# Patient Record
Sex: Male | Born: 1995 | Race: White | Hispanic: Yes | Marital: Single | Smoking: Never smoker
Health system: Southern US, Community
[De-identification: ages and names within clinical notes are randomized; demographics above are authoritative.]

## PROBLEM LIST (undated history)

## (undated) DIAGNOSIS — S21339A Puncture wound without foreign body of unspecified front wall of thorax with penetration into thoracic cavity, initial encounter: Secondary | ICD-10-CM

---
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
## 2019-07-10 ENCOUNTER — Encounter (HOSPITAL_COMMUNITY): Payer: Self-pay

## 2019-07-10 ENCOUNTER — Emergency Department (HOSPITAL_COMMUNITY): Payer: Worker's Compensation

## 2019-07-10 ENCOUNTER — Other Ambulatory Visit: Payer: Self-pay

## 2019-07-10 ENCOUNTER — Emergency Department (HOSPITAL_COMMUNITY)
Admission: EM | Admit: 2019-07-10 | Discharge: 2019-07-11 | Disposition: A | Discharge status: Home / Self Care | Payer: Worker's Compensation | Attending: Emergency Medicine | Admitting: Emergency Medicine

## 2019-07-10 DIAGNOSIS — S32009A Unspecified fracture of unspecified lumbar vertebra, initial encounter for closed fracture: Secondary | ICD-10-CM | POA: Insufficient documentation

## 2019-07-10 DIAGNOSIS — Y999 Unspecified external cause status: Secondary | ICD-10-CM | POA: Diagnosis not present

## 2019-07-10 DIAGNOSIS — M549 Dorsalgia, unspecified: Secondary | ICD-10-CM | POA: Diagnosis present

## 2019-07-10 DIAGNOSIS — W11XXXA Fall on and from ladder, initial encounter: Secondary | ICD-10-CM | POA: Diagnosis not present

## 2019-07-10 DIAGNOSIS — S22080A Wedge compression fracture of T11-T12 vertebra, initial encounter for closed fracture: Secondary | ICD-10-CM | POA: Diagnosis not present

## 2019-07-10 DIAGNOSIS — N2889 Other specified disorders of kidney and ureter: Secondary | ICD-10-CM

## 2019-07-10 DIAGNOSIS — Y93H9 Activity, other involving exterior property and land maintenance, building and construction: Secondary | ICD-10-CM | POA: Insufficient documentation

## 2019-07-10 DIAGNOSIS — Y92017 Garden or yard in single-family (private) house as the place of occurrence of the external cause: Secondary | ICD-10-CM | POA: Insufficient documentation

## 2019-07-10 LAB — BASIC METABOLIC PANEL
Anion gap: 12 (ref 5–15)
BUN: 12 mg/dL (ref 6–20)
CO2: 21 mmol/L — ABNORMAL LOW (ref 22–32)
Calcium: 8.7 mg/dL — ABNORMAL LOW (ref 8.9–10.3)
Chloride: 104 mmol/L (ref 98–111)
Creatinine, Ser: 0.78 mg/dL (ref 0.61–1.24)
GFR calc Af Amer: >60 mL/min (ref >60)
GFR calc non Af Amer: >60 mL/min (ref >60)
Glucose, Bld: 114 mg/dL — ABNORMAL HIGH (ref 70–99)
Potassium: 3.8 mmol/L (ref 3.5–5.1)
Sodium: 137 mmol/L (ref 135–145)

## 2019-07-10 LAB — CBC WITH DIFFERENTIAL/PLATELET
Abs Immature Granulocytes: 0.09 10*3/uL — ABNORMAL HIGH (ref 0.00–0.07)
Basophils Absolute: 0.1 10*3/uL (ref 0.0–0.1)
Basophils Relative: 0 %
Eosinophils Absolute: 0 10*3/uL (ref 0.0–0.5)
Eosinophils Relative: 0 %
HCT: 41.8 % (ref 39.0–52.0)
Hemoglobin: 14.5 g/dL (ref 13.0–17.0)
Immature Granulocytes: 1 %
Lymphocytes Relative: 9 %
Lymphs Abs: 1.4 10*3/uL (ref 0.7–4.0)
MCH: 33 pg (ref 26.0–34.0)
MCHC: 34.7 g/dL (ref 30.0–36.0)
MCV: 95.2 fL (ref 80.0–100.0)
Monocytes Absolute: 0.9 10*3/uL (ref 0.1–1.0)
Monocytes Relative: 6 %
Neutro Abs: 12.4 10*3/uL — ABNORMAL HIGH (ref 1.7–7.7)
Neutrophils Relative %: 84 %
Platelets: 206 10*3/uL (ref 150–400)
RBC: 4.39 MIL/uL (ref 4.22–5.81)
RDW: 12.6 % (ref 11.5–15.5)
WBC: 14.8 10*3/uL — ABNORMAL HIGH (ref 4.0–10.5)
nRBC: 0 % (ref 0.0–0.2)

## 2019-07-10 MED ORDER — MORPHINE SULFATE (PF) 4 MG/ML IV SOLN
6.0000 mg | Freq: Once | INTRAVENOUS | Status: AC
  Administered 2019-07-10: 23:00:00 6 mg via INTRAVENOUS
  Filled 2019-07-10: qty 2

## 2019-07-10 MED ORDER — FENTANYL CITRATE (PF) 100 MCG/2ML IJ SOLN
100.0000 ug | Freq: Once | INTRAMUSCULAR | Status: AC
  Administered 2019-07-10: 20:00:00 100 ug via INTRAVENOUS
  Filled 2019-07-10: qty 2

## 2019-07-10 MED ORDER — IOHEXOL 300 MG/ML  SOLN
100.0000 mL | Freq: Once | INTRAMUSCULAR | Status: AC | PRN
  Administered 2019-07-10: 23:00:00 100 mL via INTRAVENOUS

## 2019-07-10 NOTE — ED Provider Notes (Signed)
[facility] EMERGENCY DEPARTMENT Provider Note   CSN: [id number] Arrival date & time: 07/10/19  1751     History Chief Complaint  Patient presents with  . Fall    Lawrence Chambers is a 23 y.o. male.  HPI Lawrence Chambers is a 23 y.o. male with no pertinent past medical history who presents to the ED for a fall.  He reports was 8 feet up on a ladder cleaning gutters on a back porch and accidentally fell backwards landing on his back.  He states somehow the ladder got between himself and the porch and hurt his lower back.  He denies any other pain or injuries.  Denies numbness, weakness, headache, syncope, vision change, chest pain, abdominal pain.  He denies any history of back problems.  No blood thinners.      History reviewed. No pertinent past medical history.  There are no problems to display for this patient.   History reviewed. No pertinent surgical history.     No family history on file.  Social History   Tobacco Use  . Smoking status: Not on file  Substance Use Topics  . Alcohol use: Not on file  . Drug use: Not on file    Home Medications Prior to Admission medications   Medication Sig Start Date End Date Taking? Authorizing Provider  oxyCODONE-acetaminophen (PERCOCET/ROXICET) 5-325 MG tablet Take 1 tablet by mouth every 8 (eight) hours as needed for severe pain. 07/11/19   McDonald, Mia A, PA-C    Allergies    Patient has no allergy information on record.  Review of Systems   Review of Systems  Constitutional: Negative for chills and fever.  HENT: Negative for congestion, ear pain and sore throat.   Eyes: Negative for pain, discharge, redness and visual disturbance.  Respiratory: Negative for cough and shortness of breath.   Cardiovascular: Negative for chest pain and palpitations.  Gastrointestinal: Negative for abdominal pain, diarrhea, nausea and vomiting.  Genitourinary: Negative for dysuria, frequency and hematuria.    Musculoskeletal: Positive for back pain. Negative for arthralgias and neck stiffness.  Skin: Negative for color change and rash.  Neurological: Negative for seizures, syncope and weakness.  Psychiatric/Behavioral: Negative for agitation.  All other systems reviewed and are negative.   Physical Exam Updated Vital Signs BP 129/78 (BP Location: Right Arm)   Pulse 70   Temp 98.8 F (37.1 C) (Oral)   Resp 18   SpO2 98%   Physical Exam Vitals and nursing note reviewed.  Constitutional:      General: He is not in acute distress.    Appearance: Normal appearance. He is well-developed. He is not ill-appearing.  HENT:     Head: Normocephalic and atraumatic.     Right Ear: External ear normal.     Left Ear: External ear normal.     Nose: Nose normal. No congestion.     Mouth/Throat:     Mouth: Mucous membranes are moist.  Eyes:     General:        Right eye: No discharge.        Left eye: No discharge.     Conjunctiva/sclera: Conjunctivae normal.  Cardiovascular:     Rate and Rhythm: Normal rate and regular rhythm.     Pulses: Normal pulses.     Heart sounds: Normal heart sounds. No murmur.  Pulmonary:     Effort: Pulmonary effort is normal. No respiratory distress.     Breath sounds: Normal breath sounds.  No wheezing or rales.  Abdominal:     General: Abdomen is flat. There is no distension.     Palpations: Abdomen is soft.     Tenderness: There is no abdominal tenderness.  Musculoskeletal:        General: Tenderness present. No signs of injury. Normal range of motion.     Cervical back: Normal range of motion and neck supple.     Comments: Midline lumbar tenderness to palpation, no step-off or deformities No thoracic or cervical midline tenderness  Skin:    General: Skin is warm and dry.     Capillary Refill: Capillary refill takes less than 2 seconds.  Neurological:     General: No focal deficit present.     Mental Status: He is alert and oriented to person, place, and  time. Mental status is at baseline.     Cranial Nerves: No cranial nerve deficit.     Sensory: No sensory deficit.     Motor: No weakness.     Coordination: Coordination normal.     Comments: Awake, alert, and oriented x4 Gait testing deferred Finger to nose intact Plantar response No pronator drift Sensation intact to LT Strength 5/5 in the BUE and BLE  CRANIAL NERVES: 2 (Optic)-PERRLA. VF intact to confrontation. 3/4/6 (Oculomotor, Trochlear, Abudcens)- EOMI 5 (Trigeminal)-Sensation intact topinprick 7 (Facial)-Symmetric facial expression 8 (Vestibulococchlear)-Responds to voice 9/10 (Glossopharyngeal)-Symmetric palate and uvula elevation 11 (Spinal accessory)-Head midline 12 (Hypoglossal)-Tongue Midline   Psychiatric:        Mood and Affect: Mood normal.        Behavior: Behavior normal.     ED Results / Procedures / Treatments   Labs (all labs ordered are listed, but only abnormal results are displayed) Labs Reviewed  BASIC METABOLIC PANEL - Abnormal; Notable for the following components:      Result Value   CO2 21 (*)    Glucose, Bld 114 (*)    Calcium 8.7 (*)    All other components within normal limits  CBC WITH DIFFERENTIAL/PLATELET - Abnormal; Notable for the following components:   WBC 14.8 (*)    Neutro Abs 12.4 (*)    Abs Immature Granulocytes 0.09 (*)    All other components within normal limits    EKG None  Radiology CT HEAD WO CONTRAST  Result Date: 07/10/2019 CLINICAL DATA:  Initial evaluation for acute trauma, fall. EXAM: CT HEAD WITHOUT CONTRAST TECHNIQUE: Contiguous axial images were obtained from the base of the skull through the vertex without intravenous contrast. COMPARISON:  None. FINDINGS: Brain: Cerebral volume within normal limits for patient age. No evidence for acute intracranial hemorrhage. No findings to suggest acute large vessel territory infarct. No mass lesion, midline shift, or mass effect. Ventricles are normal  in size without evidence for hydrocephalus. No extra-axial fluid collection identified. Vascular: No hyperdense vessel identified. Skull: Scalp soft tissues demonstrate no acute abnormality. Calvarium intact. Sinuses/Orbits: Globes and orbital soft tissues within normal limits. Visualized paranasal sinuses are clear. No mastoid effusion. IMPRESSION: Negative head CT.  No acute intracranial abnormality. Electronically Signed   By: Jeannine Boga M.D.   On: 07/10/2019 23:29   CT CHEST W CONTRAST  Result Date: 07/11/2019 CLINICAL DATA:  Initial evaluation for acute trauma, fall from ladder. EXAM: CT CHEST, ABDOMEN, AND PELVIS WITH CONTRAST TECHNIQUE: Multidetector CT imaging of the chest, abdomen and pelvis was performed following the standard protocol during bolus administration of intravenous contrast. CONTRAST:  179mL OMNIPAQUE IOHEXOL 300 MG/ML  SOLN COMPARISON:  Comparison made with prior CTs of the thoracic and lumbar spine from earlier the same Lawrence Chambers. FINDINGS: CT CHEST FINDINGS Cardiovascular: Normal intravascular enhancement seen throughout the intrathoracic aorta without aneurysm or acute traumatic injury. Visualized great vessels intact and normal. Heart size within normal limits. No pericardial effusion. Limited assessment of the pulmonary arterial tree grossly unremarkable. Mediastinum/Nodes: Visualized thyroid within normal limits. No pathologically enlarged mediastinal, hilar, or axillary lymph nodes. No mediastinal hematoma or mass. Esophagus within normal limits. Lungs/Pleura: Tracheobronchial tree intact and patent. Lungs well inflated bilaterally. No focal infiltrates. No edema or effusion. No pneumothorax. No worrisome pulmonary nodule or mass. Musculoskeletal: External soft tissues demonstrate no acute finding. No acute fracture within the thorax. No discrete lytic or blastic osseous lesions. CT ABDOMEN PELVIS FINDINGS Hepatobiliary: Diffuse hypoattenuation liver consistent with steatosis.  Gallbladder within normal limits. No biliary dilatation. Pancreas: Pancreas within normal limits. Spleen: Spleen intact and normal. Adrenals/Urinary Tract: Adrenal glands within normal limits. Kidneys equal size with symmetric enhancement. There is a subtle 2.3 cm enhancing mass at the interpolar left kidney, best seen on delayed sequence (series 8, image 7). No other focal renal lesion. No nephrolithiasis or hydronephrosis. No hydroureter. Partially distended bladder within normal limits. Stomach/Bowel: Stomach within normal limits. No evidence for bowel obstruction or acute bowel injury. No acute inflammatory changes seen about the bowels. Vascular/Lymphatic: Normal intravascular enhancement seen throughout the intra-abdominal aorta and its branch vessels. No adenopathy. Reproductive: Prostate seminal vesicles within normal limits. Other: No free air or fluid. No mesenteric or retroperitoneal hematoma. Musculoskeletal: Small focus of soft tissue stranding seen involving the subcutaneous fat of the left gluteal region, which could reflect mild contusion (series 3, image 128). No acute fracture within the pelvis. Previously noted T12 and L1 vertebral body fractures again noted, better evaluated on prior CTs of the spine. No discrete or worrisome osseous lesions. IMPRESSION: 1. Acute T12 and L1 vertebral fractures, better evaluated on previous spinal CTs. 2. Small focus of soft tissue stranding involving the subcutaneous fat of the left gluteal region, which could reflect mild contusion. 3. No other CT evidence for acute traumatic injury within the chest, abdomen, and pelvis. 4. Subtle 2.3 cm mass at the interpolar left kidney, indeterminate, but could reflect an underlying renal neoplasm. Further evaluation with dedicated renal mass protocol MRI recommended. Additionally, urology consultation suggested as clinically warranted. 5. Hepatic steatosis. Electronically Signed   By: Rise Mu M.D.   On:  07/11/2019 00:48   CT Cervical Spine Wo Contrast  Result Date: 07/10/2019 CLINICAL DATA:  23 year old male with history of trauma after a fall from a ladder. EXAM: CT CERVICAL SPINE WITHOUT CONTRAST TECHNIQUE: Multidetector CT imaging of the cervical spine was performed without intravenous contrast. Multiplanar CT image reconstructions were also generated. COMPARISON:  No priors. FINDINGS: Alignment: Normal. Skull base and vertebrae: No acute fracture. No primary bone lesion or focal pathologic process. Soft tissues and spinal canal: No prevertebral fluid or swelling. No visible canal hematoma. Disc levels: No significant degenerative disc disease or facet arthropathy. Upper chest: Unremarkable. Other: There are no aggressive appearing lytic or blastic lesions noted in the visualized portions of the skeleton. IMPRESSION: 1. No evidence of significant acute traumatic injury to the cervical spine. Electronically Signed   By: Trudie Reed M.D.   On: 07/10/2019 20:41   CT Thoracic Spine Wo Contrast  Result Date: 07/10/2019 CLINICAL DATA:  Initial evaluation for acute trauma, fall. EXAM: CT THORACIC AND LUMBAR SPINE WITHOUT CONTRAST TECHNIQUE: Multidetector CT imaging  of the thoracic and lumbar spine was performed without contrast. Multiplanar CT image reconstructions were also generated. COMPARISON:  None. FINDINGS: CT THORACIC SPINE FINDINGS Alignment: Vertebral bodies normally aligned with preservation of the normal thoracic kyphosis. No listhesis. Vertebrae: Acute burst type compression fracture involving the L1 vertebral body noted, described on concomitant lumbar spine portion of this exam. Subtle height loss in conchae cavity present at the superior endplate of T12, suspicious for an acute compression fracture. Height loss measures no more than 20%. No bony retropulsion. Vertebral body height otherwise maintained. No other acute or chronic fracture. No discrete osseous lesions. Paraspinal and other  soft tissues: Paraspinous soft tissues demonstrate no acute finding. Partially visualized lungs are clear. Visualized visceral structures unremarkable. Disc levels: No significant disc pathology seen within the thoracic spine. No stenosis. CT LUMBAR SPINE FINDINGS Segmentation: Standard. Lowest well-formed disc space labeled the L5-S1 level. Alignment: Chronic bilateral pars defects at L5 with associated trace 3 mm spondylolisthesis. Alignment otherwise normal with preservation of the normal lumbar lordosis. Vertebrae: Acute burst type compression fracture involving the L1 vertebral body is seen. Associated height loss of up to 35% with trace 2 mm bony retropulsion. No significant spinal stenosis. There is an associated minimally displaced fracture of the superior aspect of the L1 spinous process (series 7, image 36). Remainder of the posterior elements appear intact. Vertebral body height otherwise maintained without acute or chronic fracture. Visualized sacrum and pelvis intact. SI joints approximated symmetric. No discrete osseous lesions. Paraspinal and other soft tissues: Paraspinous soft tissues demonstrate no acute finding. Visualized visceral structures and intra-abdominal contents within normal limits. Disc levels: L5-S1: Chronic bilateral pars defects with associated 3 mm spondylolisthesis. Associated broad base right subarticular disc protrusion with minimal endplate spurring. Protruding disc contacts and mildly displaces the descending right S1 nerve root as it courses through the right lateral recess (series 4, image 88). Central canal remains patent. Mild to moderate bilateral L5 foraminal stenosis. IMPRESSION: CT THORACIC SPINE IMPRESSION 1. Subtle concavity at the superior endplate of T12, suspicious for an acute compression fracture. Height loss measures no more than 20%. No bony retropulsion. 2. No other acute traumatic injury within the thoracic spine. CT LUMBAR SPINE IMPRESSION 1. Acute burst  type compression fracture involving the L1 vertebral body with up to 35% height loss and 2 mm bony retropulsion. No significant spinal stenosis. 2. Associated acute nondisplaced fracture of the L1 spinous process. 3. No other acute traumatic injury within the lumbar spine. 4. Chronic bilateral pars defects at L5-S1 with associated broad-based right subarticular disc protrusion, potentially affecting the descending right S1 nerve root. Electronically Signed   By: Rise MuBenjamin  McClintock M.D.   On: 07/10/2019 20:50   CT Lumbar Spine Wo Contrast  Result Date: 07/10/2019 CLINICAL DATA:  Initial evaluation for acute trauma, fall. EXAM: CT THORACIC AND LUMBAR SPINE WITHOUT CONTRAST TECHNIQUE: Multidetector CT imaging of the thoracic and lumbar spine was performed without contrast. Multiplanar CT image reconstructions were also generated. COMPARISON:  None. FINDINGS: CT THORACIC SPINE FINDINGS Alignment: Vertebral bodies normally aligned with preservation of the normal thoracic kyphosis. No listhesis. Vertebrae: Acute burst type compression fracture involving the L1 vertebral body noted, described on concomitant lumbar spine portion of this exam. Subtle height loss in conchae cavity present at the superior endplate of T12, suspicious for an acute compression fracture. Height loss measures no more than 20%. No bony retropulsion. Vertebral body height otherwise maintained. No other acute or chronic fracture. No discrete osseous lesions. Paraspinal and  other soft tissues: Paraspinous soft tissues demonstrate no acute finding. Partially visualized lungs are clear. Visualized visceral structures unremarkable. Disc levels: No significant disc pathology seen within the thoracic spine. No stenosis. CT LUMBAR SPINE FINDINGS Segmentation: Standard. Lowest well-formed disc space labeled the L5-S1 level. Alignment: Chronic bilateral pars defects at L5 with associated trace 3 mm spondylolisthesis. Alignment otherwise normal with  preservation of the normal lumbar lordosis. Vertebrae: Acute burst type compression fracture involving the L1 vertebral body is seen. Associated height loss of up to 35% with trace 2 mm bony retropulsion. No significant spinal stenosis. There is an associated minimally displaced fracture of the superior aspect of the L1 spinous process (series 7, image 36). Remainder of the posterior elements appear intact. Vertebral body height otherwise maintained without acute or chronic fracture. Visualized sacrum and pelvis intact. SI joints approximated symmetric. No discrete osseous lesions. Paraspinal and other soft tissues: Paraspinous soft tissues demonstrate no acute finding. Visualized visceral structures and intra-abdominal contents within normal limits. Disc levels: L5-S1: Chronic bilateral pars defects with associated 3 mm spondylolisthesis. Associated broad base right subarticular disc protrusion with minimal endplate spurring. Protruding disc contacts and mildly displaces the descending right S1 nerve root as it courses through the right lateral recess (series 4, image 88). Central canal remains patent. Mild to moderate bilateral L5 foraminal stenosis. IMPRESSION: CT THORACIC SPINE IMPRESSION 1. Subtle concavity at the superior endplate of T12, suspicious for an acute compression fracture. Height loss measures no more than 20%. No bony retropulsion. 2. No other acute traumatic injury within the thoracic spine. CT LUMBAR SPINE IMPRESSION 1. Acute burst type compression fracture involving the L1 vertebral body with up to 35% height loss and 2 mm bony retropulsion. No significant spinal stenosis. 2. Associated acute nondisplaced fracture of the L1 spinous process. 3. No other acute traumatic injury within the lumbar spine. 4. Chronic bilateral pars defects at L5-S1 with associated broad-based right subarticular disc protrusion, potentially affecting the descending right S1 nerve root. Electronically Signed   By:  Rise Mu M.D.   On: 07/10/2019 20:50   CT ABDOMEN PELVIS W CONTRAST  Result Date: 07/11/2019 CLINICAL DATA:  Initial evaluation for acute trauma, fall from ladder. EXAM: CT CHEST, ABDOMEN, AND PELVIS WITH CONTRAST TECHNIQUE: Multidetector CT imaging of the chest, abdomen and pelvis was performed following the standard protocol during bolus administration of intravenous contrast. CONTRAST:  OMNIPAQUE IOHEXOL 300 MG/ML  SOLN COMPARISON:  Comparison made with prior CTs of the thoracic and lumbar spine from earlier the same Lawrence Chambers. FINDINGS: CT CHEST FINDINGS Cardiovascular: Normal intravascular enhancement seen throughout the intrathoracic aorta without aneurysm or acute traumatic injury. Visualized great vessels intact and normal. Heart size within normal limits. No pericardial effusion. Limited assessment of the pulmonary arterial tree grossly unremarkable. Mediastinum/Nodes: Visualized thyroid within normal limits. No pathologically enlarged mediastinal, hilar, or axillary lymph nodes. No mediastinal hematoma or mass. Esophagus within normal limits. Lungs/Pleura: Tracheobronchial tree intact and patent. Lungs well inflated bilaterally. No focal infiltrates. No edema or effusion. No pneumothorax. No worrisome pulmonary nodule or mass. Musculoskeletal: External soft tissues demonstrate no acute finding. No acute fracture within the thorax. No discrete lytic or blastic osseous lesions. CT ABDOMEN PELVIS FINDINGS Hepatobiliary: Diffuse hypoattenuation liver consistent with steatosis. Gallbladder within normal limits. No biliary dilatation. Pancreas: Pancreas within normal limits. Spleen: Spleen intact and normal. Adrenals/Urinary Tract: Adrenal glands within normal limits. Kidneys equal size with symmetric enhancement. There is a subtle 2.3 cm enhancing mass at the interpolar left  kidney, best seen on delayed sequence (series 8, image 7). No other focal renal lesion. No nephrolithiasis or  hydronephrosis. No hydroureter. Partially distended bladder within normal limits. Stomach/Bowel: Stomach within normal limits. No evidence for bowel obstruction or acute bowel injury. No acute inflammatory changes seen about the bowels. Vascular/Lymphatic: Normal intravascular enhancement seen throughout the intra-abdominal aorta and its branch vessels. No adenopathy. Reproductive: Prostate seminal vesicles within normal limits. Other: No free air or fluid. No mesenteric or retroperitoneal hematoma. Musculoskeletal: Small focus of soft tissue stranding seen involving the subcutaneous fat of the left gluteal region, which could reflect mild contusion (series 3, image 128). No acute fracture within the pelvis. Previously noted T12 and L1 vertebral body fractures again noted, better evaluated on prior CTs of the spine. No discrete or worrisome osseous lesions. IMPRESSION: 1. Acute T12 and L1 vertebral fractures, better evaluated on previous spinal CTs. 2. Small focus of soft tissue stranding involving the subcutaneous fat of the left gluteal region, which could reflect mild contusion. 3. No other CT evidence for acute traumatic injury within the chest, abdomen, and pelvis. 4. Subtle 2.3 cm mass at the interpolar left kidney, indeterminate, but could reflect an underlying renal neoplasm. Further evaluation with dedicated renal mass protocol MRI recommended. Additionally, urology consultation suggested as clinically warranted. 5. Hepatic steatosis. Electronically Signed   By: Rise Mu M.D.   On: 07/11/2019 00:48    Procedures Procedures (including critical care time)  Medications Ordered in ED Medications  fentaNYL (SUBLIMAZE) injection 100 mcg (100 mcg Intravenous Given 07/10/19 1957)  iohexol (OMNIPAQUE) 300 MG/ML solution 100 mL (100 mLs Intravenous Contrast Given 07/10/19 2238)  morphine 4 MG/ML injection 6 mg (6 mg Intravenous Given 07/10/19 2254)  fentaNYL (SUBLIMAZE) injection 100 mcg (100  mcg Intravenous Given 07/11/19 0123)  oxyCODONE-acetaminophen (PERCOCET/ROXICET) 5-325 MG per tablet 1 tablet (1 tablet Oral Given 07/11/19 0222)  acetaminophen (TYLENOL) tablet 325 mg (325 mg Oral Given 07/11/19 0222)    ED Course  I have reviewed the triage vital signs and the nursing notes.  Pertinent labs & imaging results that were available during my care of the patient were reviewed by me and considered in my medical decision making (see chart for details).    MDM Rules/Calculators/A&P                      WERNER LABELLA is a 23 y.o. male with no pertinent past medical history who presents to the ED for a fall.  He reports was 8 feet up on a ladder cleaning gutters on a back porch and accidentally fell backwards landing on his back.  He states somehow the ladder got between himself and the porch and hurt his lower back.  He denies any other pain or injuries.  Denies numbness, weakness, headache, syncope, vision change, chest pain, abdominal pain.  He denies any history of back problems.  No blood thinners.  HPI and physical exam as above. He presents awake, alert, hemodynamically stable, afebrile, non toxic. He does have midline lumbar tenderness to palpation, no step-off or deformities. No thoracic or cervical midline tenderness. Nonfocal neurologic exam.   CT cervical thoracic and lumbar spine significant for: 1. Acute burst type compression fracture involving the L1 vertebral body with up to 35% height loss and 2 mm bony retropulsion. No significant spinal stenosis. 2. Associated acute nondisplaced fracture of the L1 spinous process. No acute injury to c spine.  For traumatic injury on spine imaging, obtained  further trauma workup and consulted neurosurgery who has recommended tlso brace when out of bed and follow up in office. At time of signout, pending results of his trauma imaging.  Pt care was handed off to Mia McDonald at [phone].  Complete history and physical and current plan have  been communicated.  Please refer to their note for the remainder of ED care and ultimate disposition. The above statements and care plan were discussed with my attending physician Dr Manus Gunning who voiced agreement with plan.     Final Clinical Impression(s) / ED Diagnoses Final diagnoses:  Compression fracture of T12 vertebra, initial encounter (HCC)  Closed fracture of spinous process of lumbar vertebra, initial encounter (HCC)  Left renal mass    Rx / DC Orders ED Discharge Orders         Ordered    oxyCODONE-acetaminophen (PERCOCET/ROXICET) 5-325 MG tablet  Every 8 hours PRN     07/11/19 0305           Niv Darley, Ladona Ridgel, MD 07/11/19 1456    Glynn Octave, MD 07/11/19 1607

## 2019-07-10 NOTE — Consult Note (Signed)
Chief Complaint   Chief Complaint  Patient presents with  . Fall    HPI   Consult requested by: Dr Day/Dr Manus Gunning EDP Reason for consult: T12 fx, L1 fracture  HPI: Lawrence Chambers is a 23 y.o. male with no past medical history who presented to ED after a fall. Was cleaning gutters and fell off ladder approx 9 foot. Landed on back. Developed severe lower back pain immediately. No head injury, no LOC. Underwent CT C/T/L spine which was significant for T12 and L1 fractures. NSY consultation requested. Complains of fairly moderate midline lower back pain, improved with meds given by EDP. Denies radicular symptoms. Denies bowel/bladder dysfunction.  There are no problems to display for this patient.   PMH: History reviewed. No pertinent past medical history.  PSH: History reviewed. No pertinent surgical history.  (Not in a hospital admission)   SH: Social History   Tobacco Use  . Smoking status: Not on file  Substance Use Topics  . Alcohol use: Not on file  . Drug use: Not on file    MEDS: Prior to Admission medications   Not on File    ALLERGY: Not on File  Social History   Tobacco Use  . Smoking status: Not on file  Substance Use Topics  . Alcohol use: Not on file     No family history on file.   ROS   Review of Systems  Constitutional: Negative.   HENT: Negative.   Eyes: Negative.   Respiratory: Negative.   Cardiovascular: Negative.   Gastrointestinal: Negative.   Genitourinary: Negative.   Musculoskeletal: Positive for back pain and falls. Negative for neck pain.  Skin: Negative.   Neurological: Negative for dizziness, tingling, tremors, sensory change, speech change, focal weakness, seizures, loss of consciousness, weakness and headaches.    Exam   Vitals:   07/10/19 2045 07/10/19 2046  BP:  120/63  Pulse: 97 (!) 104  Resp: (!) 22 (!) 22  Temp:    SpO2: 99% 98%   General appearance: WDWN, NAD Eyes: No scleral  injection Cardiovascular: Regular rate and rhythm without murmurs, rubs, gallops. No edema or variciosities. Distal pulses normal. Pulmonary: Effort normal, non-labored breathing Musculoskeletal:     Muscle tone upper extremities: Normal    Muscle tone lower extremities: Normal    Motor exam: Upper Extremities Deltoid Bicep Tricep Grip  Right 5/5 5/5 5/5 5/5  Left 5/5 5/5 5/5 5/5   Lower Extremity IP Quad PF DF EHL  Right 5/5 5/5 5/5 5/5 5/5  Left 5/5 5/5 5/5 5/5 5/5   Neurological Mental Status:    - Patient is awake, alert, oriented to person, place, month, year, and situation    - Patient is able to give a clear and coherent history.    - No signs of aphasia or neglect Cranial Nerves    - II: Visual Fields are full. PERRL    - III/IV/VI: EOMI without ptosis or diploplia.     - V: Facial sensation is grossly normal    - VII: Facial movement is symmetric.     - VIII: hearing is intact to voice    - X: Uvula elevates symmetrically    - XI: Shoulder shrug is symmetric.    - XII: tongue is midline without atrophy or fasciculations.  Sensory: Sensation grossly intact to LT  Results - Imaging/Labs   Results for orders placed or performed during the hospital encounter of 07/10/19 (from the past 48 hour(s))  CBC  with Differential     Status: Abnormal   Collection Time: 07/10/19  9:15 PM  Result Value Ref Range   WBC 14.8 (H) 4.0 - 10.5 K/uL   RBC 4.39 4.22 - 5.81 MIL/uL   Hemoglobin 14.5 13.0 - 17.0 g/dL   HCT 16.141.8 09.639.0 - 04.552.0 %   MCV 95.2 80.0 - 100.0 fL   MCH 33.0 26.0 - 34.0 pg   MCHC 34.7 30.0 - 36.0 g/dL   RDW 40.912.6 81.111.5 - 91.415.5 %   Platelets 206 150 - 400 K/uL   nRBC 0.0 0.0 - 0.2 %   Neutrophils Relative % 84 %   Neutro Abs 12.4 (H) 1.7 - 7.7 K/uL   Lymphocytes Relative 9 %   Lymphs Abs 1.4 0.7 - 4.0 K/uL   Monocytes Relative 6 %   Monocytes Absolute 0.9 0.1 - 1.0 K/uL   Eosinophils Relative 0 %   Eosinophils Absolute 0.0 0.0 - 0.5 K/uL   Basophils Relative 0 %    Basophils Absolute 0.1 0.0 - 0.1 K/uL   Immature Granulocytes 1 %   Abs Immature Granulocytes 0.09 (H) 0.00 - 0.07 K/uL    Comment: Performed at [facility], [address] [place], [place] [id number]    CT Cervical Spine Wo Contrast  Result Date: 07/10/2019 CLINICAL DATA:  23 year old male with history of trauma after a fall from a ladder. EXAM: CT CERVICAL SPINE WITHOUT CONTRAST TECHNIQUE: Multidetector CT imaging of the cervical spine was performed without intravenous contrast. Multiplanar CT image reconstructions were also generated. COMPARISON:  No priors. FINDINGS: Alignment: Normal. Skull base and vertebrae: No acute fracture. No primary bone lesion or focal pathologic process. Soft tissues and spinal canal: No prevertebral fluid or swelling. No visible canal hematoma. Disc levels: No significant degenerative disc disease or facet arthropathy. Upper chest: Unremarkable. Other: There are no aggressive appearing lytic or blastic lesions noted in the visualized portions of the skeleton. IMPRESSION: 1. No evidence of significant acute traumatic injury to the cervical spine. Electronically Signed   By: Trudie Reedaniel  Entrikin M.D.   On: 07/10/2019 20:41   CT Thoracic Spine Wo Contrast  Result Date: 07/10/2019 CLINICAL DATA:  Initial evaluation for acute trauma, fall. EXAM: CT THORACIC AND LUMBAR SPINE WITHOUT CONTRAST TECHNIQUE: Multidetector CT imaging of the thoracic and lumbar spine was performed without contrast. Multiplanar CT image reconstructions were also generated. COMPARISON:  None. FINDINGS: CT THORACIC SPINE FINDINGS Alignment: Vertebral bodies normally aligned with preservation of the normal thoracic kyphosis. No listhesis. Vertebrae: Acute burst type compression fracture involving the L1 vertebral body noted, described on concomitant lumbar spine portion of this exam. Subtle height loss in conchae cavity present at the superior endplate of T12, suspicious for an acute compression  fracture. Height loss measures no more than 20%. No bony retropulsion. Vertebral body height otherwise maintained. No other acute or chronic fracture. No discrete osseous lesions. Paraspinal and other soft tissues: Paraspinous soft tissues demonstrate no acute finding. Partially visualized lungs are clear. Visualized visceral structures unremarkable. Disc levels: No significant disc pathology seen within the thoracic spine. No stenosis. CT LUMBAR SPINE FINDINGS Segmentation: Standard. Lowest well-formed disc space labeled the L5-S1 level. Alignment: Chronic bilateral pars defects at L5 with associated trace 3 mm spondylolisthesis. Alignment otherwise normal with preservation of the normal lumbar lordosis. Vertebrae: Acute burst type compression fracture involving the L1 vertebral body is seen. Associated height loss of up to 35% with trace 2 mm bony retropulsion. No significant spinal stenosis. There  is an associated minimally displaced fracture of the superior aspect of the L1 spinous process (series 7, image 36). Remainder of the posterior elements appear intact. Vertebral body height otherwise maintained without acute or chronic fracture. Visualized sacrum and pelvis intact. SI joints approximated symmetric. No discrete osseous lesions. Paraspinal and other soft tissues: Paraspinous soft tissues demonstrate no acute finding. Visualized visceral structures and intra-abdominal contents within normal limits. Disc levels: L5-S1: Chronic bilateral pars defects with associated 3 mm spondylolisthesis. Associated broad base right subarticular disc protrusion with minimal endplate spurring. Protruding disc contacts and mildly displaces the descending right S1 nerve root as it courses through the right lateral recess (series 4, image 88). Central canal remains patent. Mild to moderate bilateral L5 foraminal stenosis. IMPRESSION: CT THORACIC SPINE IMPRESSION 1. Subtle concavity at the superior endplate of K09, suspicious  for an acute compression fracture. Height loss measures no more than 20%. No bony retropulsion. 2. No other acute traumatic injury within the thoracic spine. CT LUMBAR SPINE IMPRESSION 1. Acute burst type compression fracture involving the L1 vertebral body with up to 35% height loss and 2 mm bony retropulsion. No significant spinal stenosis. 2. Associated acute nondisplaced fracture of the L1 spinous process. 3. No other acute traumatic injury within the lumbar spine. 4. Chronic bilateral pars defects at L5-S1 with associated broad-based right subarticular disc protrusion, potentially affecting the descending right S1 nerve root. Electronically Signed   By: Jeannine Boga M.D.   On: 07/10/2019 20:50   CT Lumbar Spine Wo Contrast  Result Date: 07/10/2019 CLINICAL DATA:  Initial evaluation for acute trauma, fall. EXAM: CT THORACIC AND LUMBAR SPINE WITHOUT CONTRAST TECHNIQUE: Multidetector CT imaging of the thoracic and lumbar spine was performed without contrast. Multiplanar CT image reconstructions were also generated. COMPARISON:  None. FINDINGS: CT THORACIC SPINE FINDINGS Alignment: Vertebral bodies normally aligned with preservation of the normal thoracic kyphosis. No listhesis. Vertebrae: Acute burst type compression fracture involving the L1 vertebral body noted, described on concomitant lumbar spine portion of this exam. Subtle height loss in conchae cavity present at the superior endplate of F81, suspicious for an acute compression fracture. Height loss measures no more than 20%. No bony retropulsion. Vertebral body height otherwise maintained. No other acute or chronic fracture. No discrete osseous lesions. Paraspinal and other soft tissues: Paraspinous soft tissues demonstrate no acute finding. Partially visualized lungs are clear. Visualized visceral structures unremarkable. Disc levels: No significant disc pathology seen within the thoracic spine. No stenosis. CT LUMBAR SPINE FINDINGS  Segmentation: Standard. Lowest well-formed disc space labeled the L5-S1 level. Alignment: Chronic bilateral pars defects at L5 with associated trace 3 mm spondylolisthesis. Alignment otherwise normal with preservation of the normal lumbar lordosis. Vertebrae: Acute burst type compression fracture involving the L1 vertebral body is seen. Associated height loss of up to 35% with trace 2 mm bony retropulsion. No significant spinal stenosis. There is an associated minimally displaced fracture of the superior aspect of the L1 spinous process (series 7, image 36). Remainder of the posterior elements appear intact. Vertebral body height otherwise maintained without acute or chronic fracture. Visualized sacrum and pelvis intact. SI joints approximated symmetric. No discrete osseous lesions. Paraspinal and other soft tissues: Paraspinous soft tissues demonstrate no acute finding. Visualized visceral structures and intra-abdominal contents within normal limits. Disc levels: L5-S1: Chronic bilateral pars defects with associated 3 mm spondylolisthesis. Associated broad base right subarticular disc protrusion with minimal endplate spurring. Protruding disc contacts and mildly displaces the descending right S1 nerve root as  it courses through the right lateral recess (series 4, image 88). Central canal remains patent. Mild to moderate bilateral L5 foraminal stenosis. IMPRESSION: CT THORACIC SPINE IMPRESSION 1. Subtle concavity at the superior endplate of T12, suspicious for an acute compression fracture. Height loss measures no more than 20%. No bony retropulsion. 2. No other acute traumatic injury within the thoracic spine. CT LUMBAR SPINE IMPRESSION 1. Acute burst type compression fracture involving the L1 vertebral body with up to 35% height loss and 2 mm bony retropulsion. No significant spinal stenosis. 2. Associated acute nondisplaced fracture of the L1 spinous process. 3. No other acute traumatic injury within the lumbar  spine. 4. Chronic bilateral pars defects at L5-S1 with associated broad-based right subarticular disc protrusion, potentially affecting the descending right S1 nerve root. Electronically Signed   By: Rise Mu M.D.   On: 07/10/2019 20:50    Impression/Plan   23 y.o. male with T12 superior endplate compression fracture, L1 burst type fracture and L1 SP fracture after falling off a ladder. No significant retropulsion/spinal stenosis. He is neurologically intact.   No indication for acute NS intervention. Will treat conservatively with TLSO brace which is to be worn when upright and OOB. If patient is unable to ambulate without significant pain, would rec admission under [facility] for pain control.  Plan to f/u outpatient in several weeks for monitoring.   Of note, full trauma scans were ordered after CT spine findings and are still pending. Please call for any concerns should these be revealing.   Cindra Presume, PA-C [facility]

## 2019-07-10 NOTE — ED Triage Notes (Signed)
Pt bib ems from home after falling approx 9 feet off his ladder. Pt hit his back on the ladder on the fall down. No loc, no blood thinners. Cspine cleared on scene. VSS. 119mcg fentanyl given en route. No neuro deficits.  150/90 HR 80 RR 20 98% RA.

## 2019-07-10 NOTE — ED Triage Notes (Addendum)
PT fell approximately 9 feet off of a ladder after slipping. Pt stated that he landed on a wooden porch. Denies LOC. Stated that he landed on his back and c/o mid back pain. Denies blood thinners or incontinence.

## 2019-07-11 MED ORDER — FENTANYL CITRATE (PF) 100 MCG/2ML IJ SOLN
100.0000 ug | Freq: Once | INTRAMUSCULAR | Status: AC
  Administered 2019-07-11: 01:00:00 100 ug via INTRAVENOUS
  Filled 2019-07-11: qty 2

## 2019-07-11 MED ORDER — OXYCODONE-ACETAMINOPHEN 5-325 MG PO TABS
1.0000 | ORAL_TABLET | Freq: Once | ORAL | Status: AC
  Administered 2019-07-11: 02:00:00 1 via ORAL
  Filled 2019-07-11: qty 1

## 2019-07-11 MED ORDER — ACETAMINOPHEN 325 MG PO TABS
325.0000 mg | ORAL_TABLET | Freq: Once | ORAL | Status: AC
  Administered 2019-07-11: 02:00:00 325 mg via ORAL
  Filled 2019-07-11: qty 1

## 2019-07-11 MED ORDER — OXYCODONE-ACETAMINOPHEN 5-325 MG PO TABS: 1.0000 | ORAL_TABLET | Freq: Three times a day (TID) | ORAL | 0 refills | Status: AC | PRN

## 2019-07-11 NOTE — ED Notes (Signed)
Patient verbalizes understanding of discharge instructions. Opportunity for questioning and answers were provided. Armband removed by staff, pt discharged from ED w/ mother

## 2019-07-11 NOTE — ED Provider Notes (Signed)
23 year old male received at signout from Dr. Lovena Le Day, medical resident under the supervision of Dr. Wyvonnia Dusky. Per his HPI:   "Lawrence Chambers is a 23 y.o. male with no pertinent past medical history who presents to the ED for a fall.  He reports was 8 feet up on a ladder cleaning gutters on a back porch and accidentally fell backwards landing on his back.  He states somehow the ladder got between himself and the porch and hurt his lower back.  He denies any other pain or injuries.  Denies numbness, weakness, headache, syncope, vision change, chest pain, abdominal pain.  He denies any history of back problems.  No blood thinners."   Physical Exam  BP (!) 136/91 (BP Location: Right Arm)   Pulse 69   Temp 98.6 F (37 C) (Oral)   Resp 20   SpO2 97%   Physical Exam Vitals and nursing note reviewed.  Constitutional:      Appearance: He is well-developed. He is obese. He is not ill-appearing, toxic-appearing or diaphoretic.     Comments: Winces in pain with positional changes  HENT:     Head: Normocephalic.  Eyes:     Conjunctiva/sclera: Conjunctivae normal.  Cardiovascular:     Rate and Rhythm: Normal rate and regular rhythm.     Heart sounds: No murmur.  Pulmonary:     Effort: Pulmonary effort is normal.  Abdominal:     General: There is no distension.     Palpations: Abdomen is soft.  Musculoskeletal:     Cervical back: Neck supple.     Right lower leg: No edema.     Left lower leg: No edema.     Comments: TLSO brace is in place.   Skin:    General: Skin is warm and dry.  Neurological:     Mental Status: He is alert.  Psychiatric:        Behavior: Behavior normal.     ED Course/Procedures     Procedures  MDM  23 year old male who fell 8 feet from a ladder onto his back received at sign out from Dr. Lovena Le Day, medical resident under the supervision of Dr. Adaline Sill. He is pending CT imaging of his chest, abdomen, and pelvis.  Imaging of the head and spine have already  resulted and have been reviewed by the primary team.  Please see their notes for further work-up and medical decision making.  CT C/A/P again demonstrating T12 and L1 vertebral fractures.  There is a mild contusion noted in the left gluteal region.  There is also an incidental finding of a subtle 2.3 cm mass at the inner pole of the left kidney that is indeterminate.  01:10-patient evaluation.  He is sitting upright on the edge of the bed and wincing in pain.  Reports that his pain was well controlled after pain medication earlier, but it is now severe.  He is adamant that he would like to try to get his pain controlled and be discharged home and avoid admission.  Will reorder fentanyl and plan to ambulate the patient.  We discussed the findings of his CT abdomen pelvis, including concern for subtle 2.3 cm mass at the inner pole of the left kidney that may warrant renal MRI and outpatient urology follow-up.  Will reorder pain medication and reassess.  01:40-RN expresses concerns as she attempted to ambulate the patient almost immediately after giving a dose of fentanyl.  I evaluated the patient with her at bedside approximately 15  minutes after getting fentanyl and he is able to stand and ambulate with well-controlled pain.  He has had multiple doses of IV pain medication since he has been here.  Will order a dose of oral pain medication to transition the patient to ensure that his pain is well controlled and that he is able to ambulate prior to discharge.  03:00-patient reevaluation.  He received oral pain medication less than an hour ago.  He reports that his pain is still well controlled.  Is feeling much better and would like to go home.  He was ambulated by me in the room and continue to report pain was well controlled.  I need is reasonable for him to be discharged at this time.  Will discharge with pain medication and follow-up to neurosurgery.  He was given instructions on his TLSO brace.  He was  also given a referral to urology as he may need outpatient renal MRI.  All questions answered.  He is hemodynamically stable and in no acute distress.  ER return precautions given.  Safe for discharge home with outpatient follow-up as indicated above.    Barkley Boards, PA-C 07/11/19 [id number]    Shon Baton, MD 07/11/19 [phone]

## 2019-07-11 NOTE — ED Notes (Signed)
Ortho at bedside to place TLSO brace.

## 2019-07-11 NOTE — Progress Notes (Signed)
Orthopedic Tech Progress Note Patient Details:  Lawrence Chambers 1996/04/23 [id number]  Patient ID: Lawrence Chambers, male   DOB: 1996-02-12, 23 y.o.   MRN: [id number] Applied TLSO and taught pt how to apply brace.  Karolee Stamps 07/11/2019, 1:06 AM

## 2019-07-11 NOTE — Discharge Instructions (Addendum)
Gracias por permitirme atenderlo [employer] [facility].  Debe llamar a la oficina de neurociruga para programar una cita de seguimiento. Debe usar el cors TLSO en cualquier momento que est de pie y fuera de la cama.  Tome 650 mg de Tylenol o 600 mg de ibuprofeno con alimentos cada 6 horas para el dolor. Puede alternar entre estos 2 medicamentos cada 3 horas si el dolor regresa. Por ejemplo, puede tomar Tylenol al medioda, seguido de una dosis de ibuprofeno a las 3, seguida de Ardelia Mems segunda dosis de Tylenol y 6. Para el dolor intenso e incontrolado, puede tomar 1 tableta de Percocet cada 8 horas segn sea necesario. No trabaje ni conduzca mientras est tomando [employer]. No tome otras sustancias sedantes, como alcohol, mientras toma este medicamento. Puede causarle problemas. Tambin puede ser [employer].  Tambin debe llamar y programar una cita de seguimiento con urologa debido al rea que vieron en su rin izquierdo. Es posible que necesite una resonancia magntica para pacientes ambulatorios. La urologa puede ayudarlo con esto.  Regrese a la sala de emergencias si su dolor se vuelve incontrolable, si comienza a tener episodios que repiten su defecacin, si no puede caminar, presenta entumecimiento o debilidad nuevos, tiene una cada o lesin, o desarrolla otros sntomas nuevos y preocupantes.  Thank you for allowing me to care for you today in the Emergency Department.   You need to call the neurosurgery office to schedule a follow-up appointment.  You need to wear the TLSO brace anytime that you are upright and out of bed.   Take 650 mg of Tylenol or 600 mg of ibuprofen with food every 6 hours for pain.  You can alternate between these 2 medications every 3 hours if your pain returns.  For instance, you can take Tylenol at noon, followed by a dose of ibuprofen at 3, followed by second dose of Tylenol and 6.  For severe, uncontrolled pain, you can take 1 tablet of Percocet  every 8 hours as needed.  Do not work or drive while taking this medication.  Do not take other sedating substances, such as alcohol, while taking this medication.  It can cause you to be impaired.  It can also be addicting.  You also need to call and schedule a follow-up appointment with urology due to the area they saw on your left kidney.  You may need an outpatient MRI.  Urology can help you with this.  Return to the emergency department if your pain becomes uncontrolled, if you start having episodes repeat your poop on yourself, if you become unable to walk, develop new numbness or weakness, have any fall or injury, or develop other new, concerning symptoms.

## 2020-09-12 ENCOUNTER — Emergency Department
Admission: EM | Admit: 2020-09-12 | Discharge: 2020-09-13 | Disposition: A | Discharge status: Home / Self Care | Payer: Self-pay | Attending: Emergency Medicine | Admitting: Emergency Medicine

## 2020-09-12 ENCOUNTER — Other Ambulatory Visit: Payer: Self-pay

## 2020-09-12 ENCOUNTER — Encounter: Payer: Self-pay | Admitting: Emergency Medicine

## 2020-09-12 ENCOUNTER — Emergency Department: Payer: Self-pay

## 2020-09-12 DIAGNOSIS — S21201A Unspecified open wound of right back wall of thorax without penetration into thoracic cavity, initial encounter: Secondary | ICD-10-CM | POA: Insufficient documentation

## 2020-09-12 DIAGNOSIS — W3400XA Accidental discharge from unspecified firearms or gun, initial encounter: Secondary | ICD-10-CM | POA: Insufficient documentation

## 2020-09-12 DIAGNOSIS — S21231A Puncture wound without foreign body of right back wall of thorax without penetration into thoracic cavity, initial encounter: Secondary | ICD-10-CM

## 2020-09-12 DIAGNOSIS — S41001A Unspecified open wound of right shoulder, initial encounter: Secondary | ICD-10-CM | POA: Insufficient documentation

## 2020-09-12 LAB — BASIC METABOLIC PANEL
Anion gap: 19 — ABNORMAL HIGH (ref 5–15)
BUN: 7 mg/dL (ref 6–20)
CO2: 17 mmol/L — ABNORMAL LOW (ref 22–32)
Calcium: 9.3 mg/dL (ref 8.9–10.3)
Chloride: 100 mmol/L (ref 98–111)
Creatinine, Ser: 0.85 mg/dL (ref 0.61–1.24)
GFR, Estimated: >60 mL/min (ref >60)
Glucose, Bld: 135 mg/dL — ABNORMAL HIGH (ref 70–99)
Potassium: 3.3 mmol/L — ABNORMAL LOW (ref 3.5–5.1)
Sodium: 136 mmol/L (ref 135–145)

## 2020-09-12 LAB — CBC WITH DIFFERENTIAL/PLATELET
Abs Immature Granulocytes: 0.17 10*3/uL — ABNORMAL HIGH (ref 0.00–0.07)
Basophils Absolute: 0.1 10*3/uL (ref 0.0–0.1)
Basophils Relative: 1 %
Eosinophils Absolute: 0 10*3/uL (ref 0.0–0.5)
Eosinophils Relative: 0 %
HCT: 45.6 % (ref 39.0–52.0)
Hemoglobin: 15.5 g/dL (ref 13.0–17.0)
Immature Granulocytes: 2 %
Lymphocytes Relative: 21 %
Lymphs Abs: 2.2 10*3/uL (ref 0.7–4.0)
MCH: 32.6 pg (ref 26.0–34.0)
MCHC: 34 g/dL (ref 30.0–36.0)
MCV: 95.8 fL (ref 80.0–100.0)
Monocytes Absolute: 0.7 10*3/uL (ref 0.1–1.0)
Monocytes Relative: 7 %
Neutro Abs: 7.4 10*3/uL (ref 1.7–7.7)
Neutrophils Relative %: 69 %
Platelets: 286 10*3/uL (ref 150–400)
RBC: 4.76 MIL/uL (ref 4.22–5.81)
RDW: 12.4 % (ref 11.5–15.5)
WBC: 10.6 10*3/uL — ABNORMAL HIGH (ref 4.0–10.5)
nRBC: 0 % (ref 0.0–0.2)

## 2020-09-12 MED ORDER — LACTATED RINGERS IV BOLUS
1000.0000 mL | Freq: Once | INTRAVENOUS | Status: AC
  Administered 2020-09-12: 1000 mL via INTRAVENOUS

## 2020-09-12 NOTE — ED Notes (Addendum)
Family at bedside updated at bedside, oriented to follow up and pan for DC after IV meds

## 2020-09-12 NOTE — Discharge Instructions (Addendum)
Keep ice on the wound off and on tonight.
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
## 2020-09-13 ENCOUNTER — Other Ambulatory Visit: Payer: Self-pay

## 2020-09-13 LAB — TYPE AND SCREEN
ABO/RH(D): A POS
Antibody Screen: NEGATIVE

## 2020-09-13 NOTE — ED Notes (Signed)
Pt alert and oriented X 4, stable for discharge. RR even and unlabored, color WNL. Discussed discharge instructions and follow-up as directed. Discharge medications discussed if prescribed. Pt had opportunity to ask questions, and RN to provide patient/family eduction. Mother at bedside. No need for interpreter.

## 2021-03-16 ENCOUNTER — Emergency Department
Admission: EM | Admit: 2021-03-16 | Discharge: 2021-03-16 | Disposition: A | Discharge status: Home / Self Care | Payer: Self-pay | Attending: Emergency Medicine | Admitting: Emergency Medicine

## 2021-03-16 ENCOUNTER — Other Ambulatory Visit: Payer: Self-pay

## 2021-03-16 DIAGNOSIS — M545 Low back pain, unspecified: Secondary | ICD-10-CM | POA: Insufficient documentation

## 2021-03-16 HISTORY — DX: Puncture wound without foreign body of unspecified front wall of thorax with penetration into thoracic cavity, initial encounter: S21.339A

## 2021-03-16 MED ORDER — MELOXICAM 15 MG PO TABS: 15.0000 mg | ORAL_TABLET | Freq: Every day | ORAL | 0 refills | Status: AC

## 2021-03-16 NOTE — ED Triage Notes (Signed)
Pt c/o lower back pain since falling off a ladder 2 years ago and is having a flare up today
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
